# Patient Record
Sex: Male | Born: 1975 | Race: White | Hispanic: No | Marital: Married | Smoking: Current every day smoker
Health system: Southern US, Community
[De-identification: ages and names within clinical notes are randomized; demographics above are authoritative.]

## PROBLEM LIST (undated history)

## (undated) HISTORY — PX: APPENDECTOMY: SHX54

## (undated) HISTORY — PX: TONSILLECTOMY: SUR1361

---

## 2016-07-03 ENCOUNTER — Encounter: Payer: Self-pay | Admitting: *Deleted

## 2016-07-03 ENCOUNTER — Ambulatory Visit
Admission: EM | Admit: 2016-07-03 | Discharge: 2016-07-03 | Disposition: A | Discharge status: Home / Self Care | Payer: Worker's Compensation | Attending: Family Medicine | Admitting: Family Medicine

## 2016-07-03 DIAGNOSIS — S5001XA Contusion of right elbow, initial encounter: Secondary | ICD-10-CM | POA: Diagnosis not present

## 2016-07-03 NOTE — ED Triage Notes (Signed)
Patient injured his right elbow yesterday while at work by hitting his elbow against a steel frame.

## 2016-07-03 NOTE — ED Provider Notes (Signed)
CSN: [id number]     Arrival date & time 07/03/16  1002 History   None    Chief Complaint  Patient presents with  . Elbow Injury  . Work Related Injury   (Consider location/radiation/quality/duration/timing/severity/associated sxs/prior Treatment) History of Present Illness  Patient Identification Francisco Boone is a 41 y.o. male.  Patient information was obtained from patient. History/Exam limitations: none.  Chief Complaint  Elbow Injury and Work Related Injury   The patient complains of pain in the right elbow pain after accidentally hitting it against equipment at work. Onset of symptoms was abrupt starting 1 day ago. There is not a history of previous right extremity injury. Patient describes pain as aching. Pain severity at onset was moderate and now is mild. The pain does not radiate. Pain is aggravated by movement, use and palpation. Pain is alleviated by rest. No associated symptoms.The patient denies other injuries. Care prior to arrival consisted of rest with relief.  History reviewed. No pertinent past medical history. History reviewed.  No pertinent family history.  No current facility-administered medications for this encounter.  No current outpatient prescriptions on file.  No Known Allergies Social History   Marital status: Married             Spouse name:                      Years of education:                 Number of children:             Occupational History   None on file  Social History Main Topics   Smoking status: Current Every Day Smoker                                                    Packs/day: 1.00      Years: 0.00          Types: Cigarettes   Smokeless tobacco: Former Neurosurgeon                      Alcohol use: Yes            Drug use: No             Sexual activity: Not on file        Other Topics            Concern   None on file  Social History Narrative   None on file   Review of Systems Pertinent items noted in HPI and remainder  of comprehensive ROS otherwise negative.   Physical Exam  BP 123/82 (BP Location: Left Arm)   Pulse 79   Temp 97.7 F (36.5 C) (Oral)   Resp 16   Ht  (1.778 m)   Wt 210 lb (95.3 kg)   SpO2 99%   BMI 30.13 kg/m  General appearance: alert, cooperative, appears stated age and no distress Lungs: clear to auscultation bilaterally Heart: regular rate and rhythm, S1, S2 normal, no murmur, click, rub or gallop Extremities: right elbow with mild tenderness upon palpation. No effusion. Full ROM. No cyanosis or edema. Neurovascular intact.  Skin: Skin color, texture, turgor normal. No rashes or lesions   Studies: None clinically indicated. Patient declined imaging.   Records Reviewed  Treatment Plan: RICE. OTC NSAIDs. May return to work without any restrictions.   Disposition: Home        History reviewed. No pertinent past medical history. Past Surgical History:  Procedure Laterality Date  . APPENDECTOMY    . TONSILLECTOMY     History reviewed. No pertinent family history. Social History  Substance Use Topics  . Smoking status: Current Every Day Smoker    Types: Cigarettes  . Smokeless tobacco: Former Neurosurgeon  . Alcohol use Yes    Review of Systems  Allergies  Patient has no known allergies.  Home Medications   Prior to Admission medications   Not on File   Meds Ordered and Administered this Visit  Medications - No data to display  BP 123/82 (BP Location: Left Arm)   Pulse 79   Temp 97.7 F (36.5 C) (Oral)   Resp 16   Ht  (1.778 m)   Wt 210 lb (95.3 kg)   SpO2 99%   BMI 30.13 kg/m  No data found.   Physical Exam  Urgent Care Course     Procedures (including critical care time)  Labs Review Labs Reviewed - No data to display  Imaging Review No results found.   Visual Acuity Review  Right Eye Distance:   Left Eye Distance:   Bilateral Distance:    Right Eye Near:   Left Eye Near:    Bilateral Near:         MDM   1.  Contusion of right elbow, initial encounter       Lurline Idol, FNP 07/03/16 1131

## 2017-01-22 ENCOUNTER — Ambulatory Visit (INDEPENDENT_AMBULATORY_CARE_PROVIDER_SITE_OTHER): Payer: BLUE CROSS/BLUE SHIELD

## 2017-01-22 ENCOUNTER — Ambulatory Visit
Admission: EM | Admit: 2017-01-22 | Discharge: 2017-01-22 | Disposition: A | Discharge status: Home / Self Care | Payer: BLUE CROSS/BLUE SHIELD | Attending: Family Medicine | Admitting: Family Medicine

## 2017-01-22 DIAGNOSIS — S82832A Other fracture of upper and lower end of left fibula, initial encounter for closed fracture: Secondary | ICD-10-CM

## 2017-01-22 DIAGNOSIS — M79605 Pain in left leg: Secondary | ICD-10-CM | POA: Diagnosis not present

## 2017-01-22 DIAGNOSIS — M25572 Pain in left ankle and joints of left foot: Secondary | ICD-10-CM | POA: Diagnosis not present

## 2017-01-22 MED ORDER — HYDROCODONE-ACETAMINOPHEN 5-325 MG PO TABS: 1.0000 | ORAL_TABLET | Freq: Three times a day (TID) | ORAL | 0 refills | Status: AC | PRN
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
## 2017-01-22 NOTE — Discharge Instructions (Signed)
See [facility] in the AM.  Ibuprofen as needed.  Take care and good luck  Dr. Adriana Simasook
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
